# Patient Record
Sex: Male | Born: 1985
Health system: Southern US, Community
[De-identification: ages and names within clinical notes are randomized; demographics above are authoritative.]

## PROBLEM LIST (undated history)

## (undated) HISTORY — PX: MANDIBLE FRACTURE SURGERY: SHX706

---
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
## 2004-03-17 ENCOUNTER — Emergency Department (HOSPITAL_COMMUNITY): Admission: EM | Admit: 2004-03-17 | Discharge: 2004-03-17 | Payer: Self-pay | Admitting: Emergency Medicine

## 2005-03-09 ENCOUNTER — Encounter: Payer: Self-pay | Admitting: Emergency Medicine

## 2005-03-10 ENCOUNTER — Emergency Department (HOSPITAL_COMMUNITY): Admission: EM | Admit: 2005-03-10 | Discharge: 2005-03-10 | Payer: Self-pay | Admitting: Emergency Medicine

## 2005-03-11 ENCOUNTER — Ambulatory Visit (HOSPITAL_COMMUNITY): Admission: RE | Admit: 2005-03-11 | Discharge: 2005-03-12 | Payer: Self-pay | Admitting: Otolaryngology

## 2006-02-15 ENCOUNTER — Emergency Department (HOSPITAL_COMMUNITY): Admission: EM | Admit: 2006-02-15 | Discharge: 2006-02-15 | Payer: Self-pay | Admitting: Emergency Medicine

## 2006-08-25 ENCOUNTER — Emergency Department (HOSPITAL_COMMUNITY): Admission: EM | Admit: 2006-08-25 | Discharge: 2006-08-25 | Payer: Self-pay | Admitting: Emergency Medicine

## 2006-09-22 ENCOUNTER — Encounter (INDEPENDENT_AMBULATORY_CARE_PROVIDER_SITE_OTHER): Payer: Self-pay | Admitting: Specialist

## 2006-09-22 ENCOUNTER — Ambulatory Visit (HOSPITAL_COMMUNITY): Admission: RE | Admit: 2006-09-22 | Discharge: 2006-09-22 | Payer: Self-pay | Admitting: Gastroenterology
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
## 2006-10-04 IMAGING — CR DG CHEST 2V
2 series · 2 of 2 positions shown · non-contrast
Comparison: None.

CLINICAL DATA: Preoperative evaluation prior to mandibular surgery.

CHEST - 2 VIEW

[view not recorded (1 of 2)]
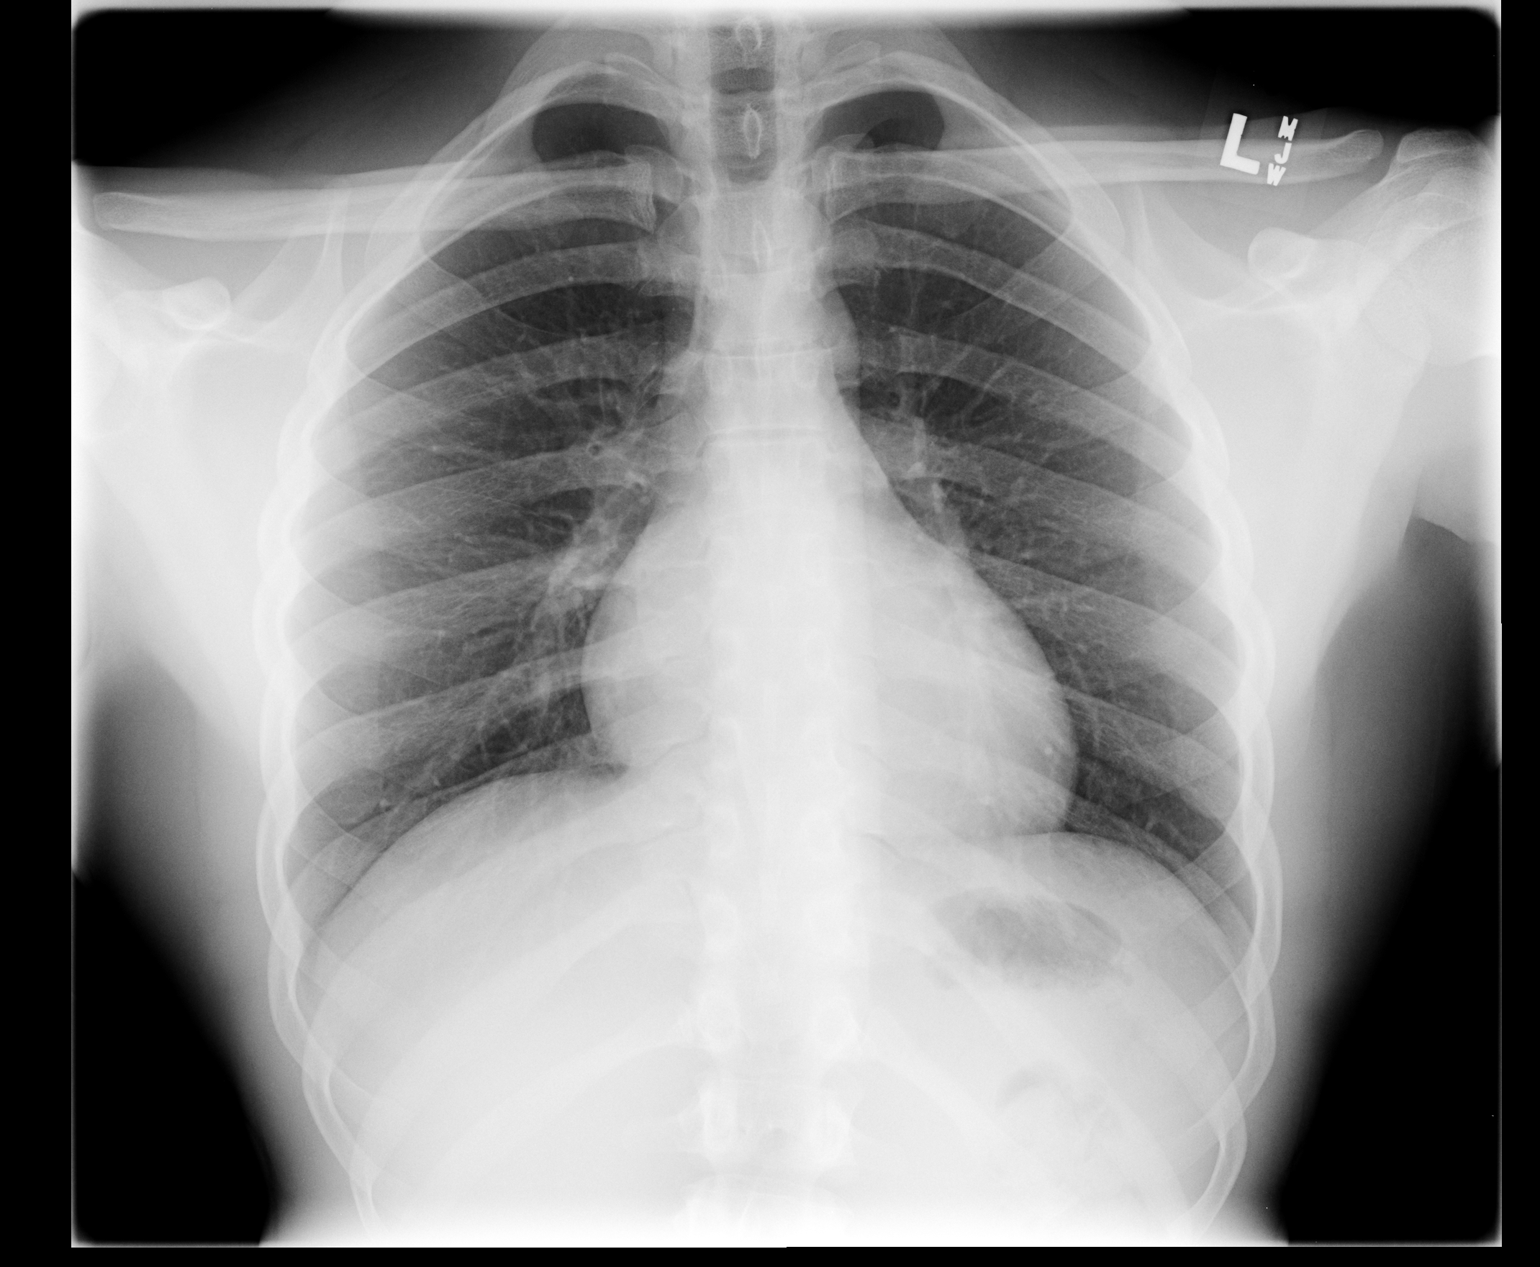

[view not recorded (2 of 2)]
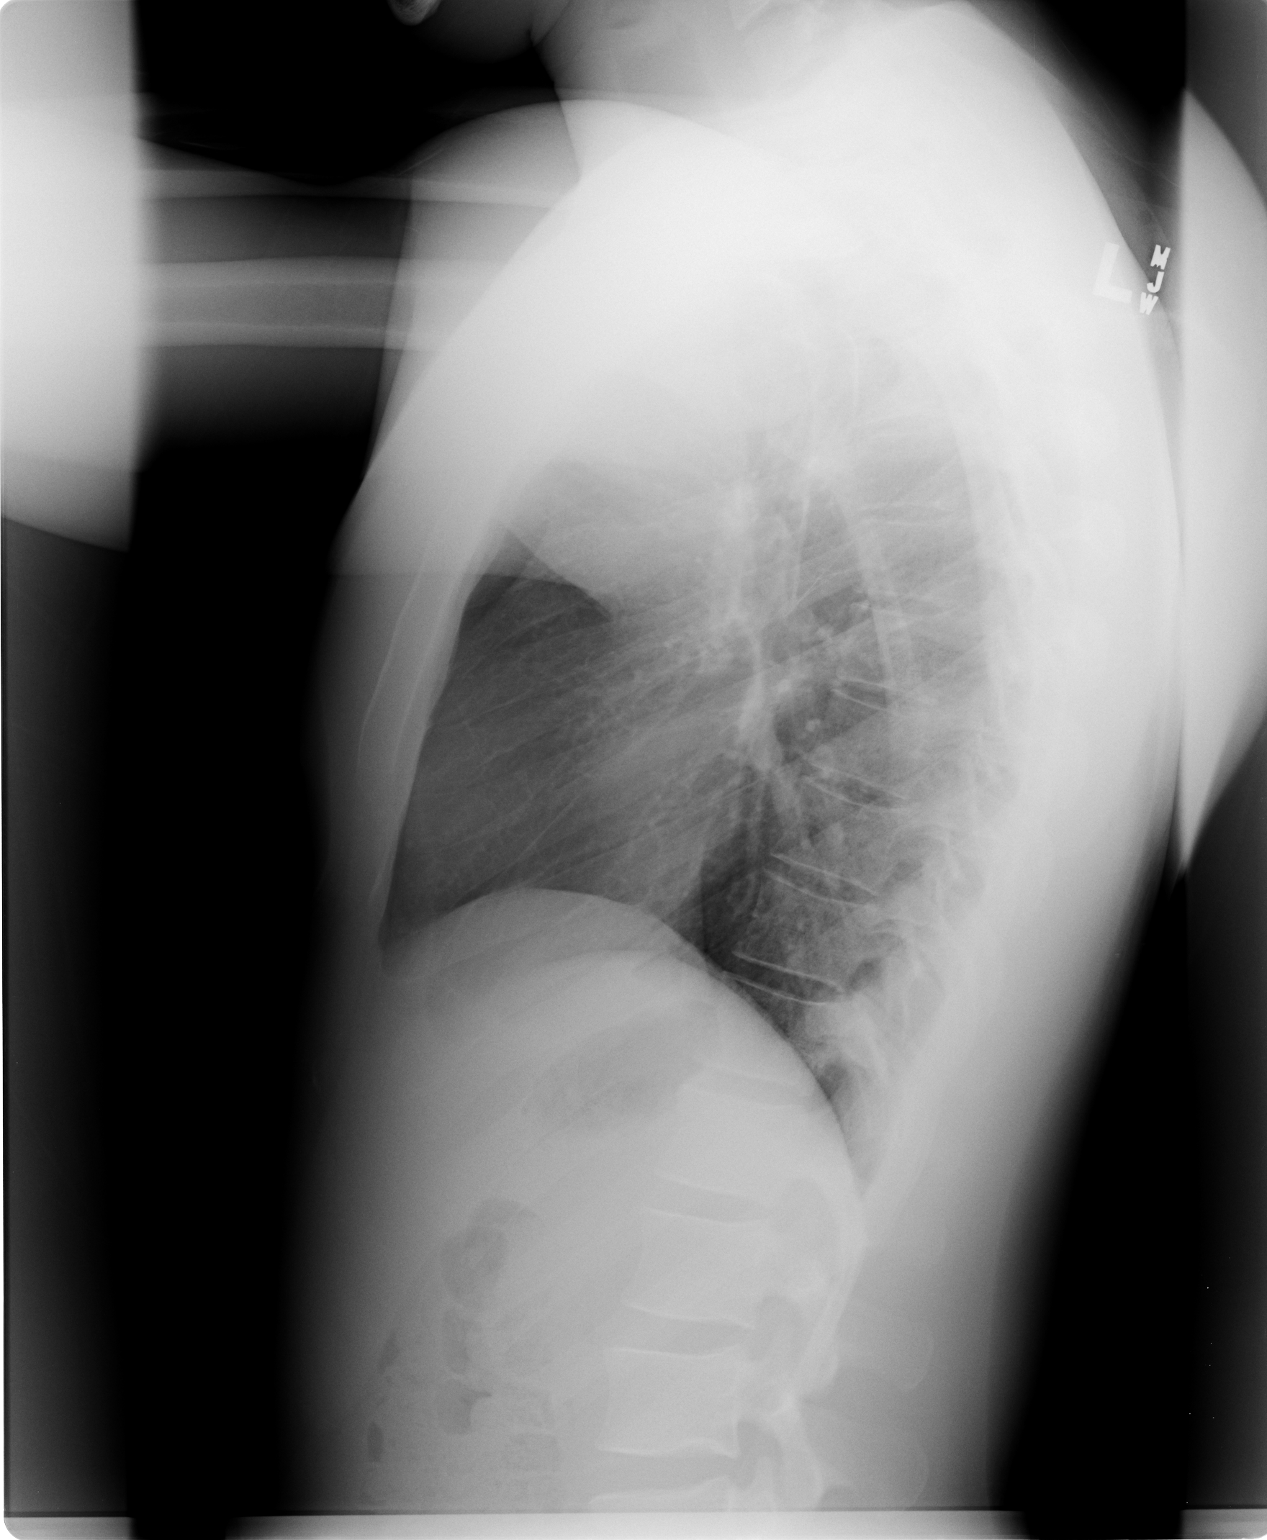

[2 of 2 positions shown; findings below may reference images not displayed]

FINDINGS: Normal sized heart. Central peribronchial thickening. Clear lungs.
Normal scoliosis.

IMPRESSION

Moderate central bronchitic changes.

## 2006-10-05 IMAGING — DX DG ORTHOPANTOGRAM /PANORAMIC
1 series · 1 of 1 positions shown · non-contrast
Comparison: 03/09/05.

CLINICAL DATA: Post reduction.  
 PANOREX ? 1 VIEW:

[view not recorded]
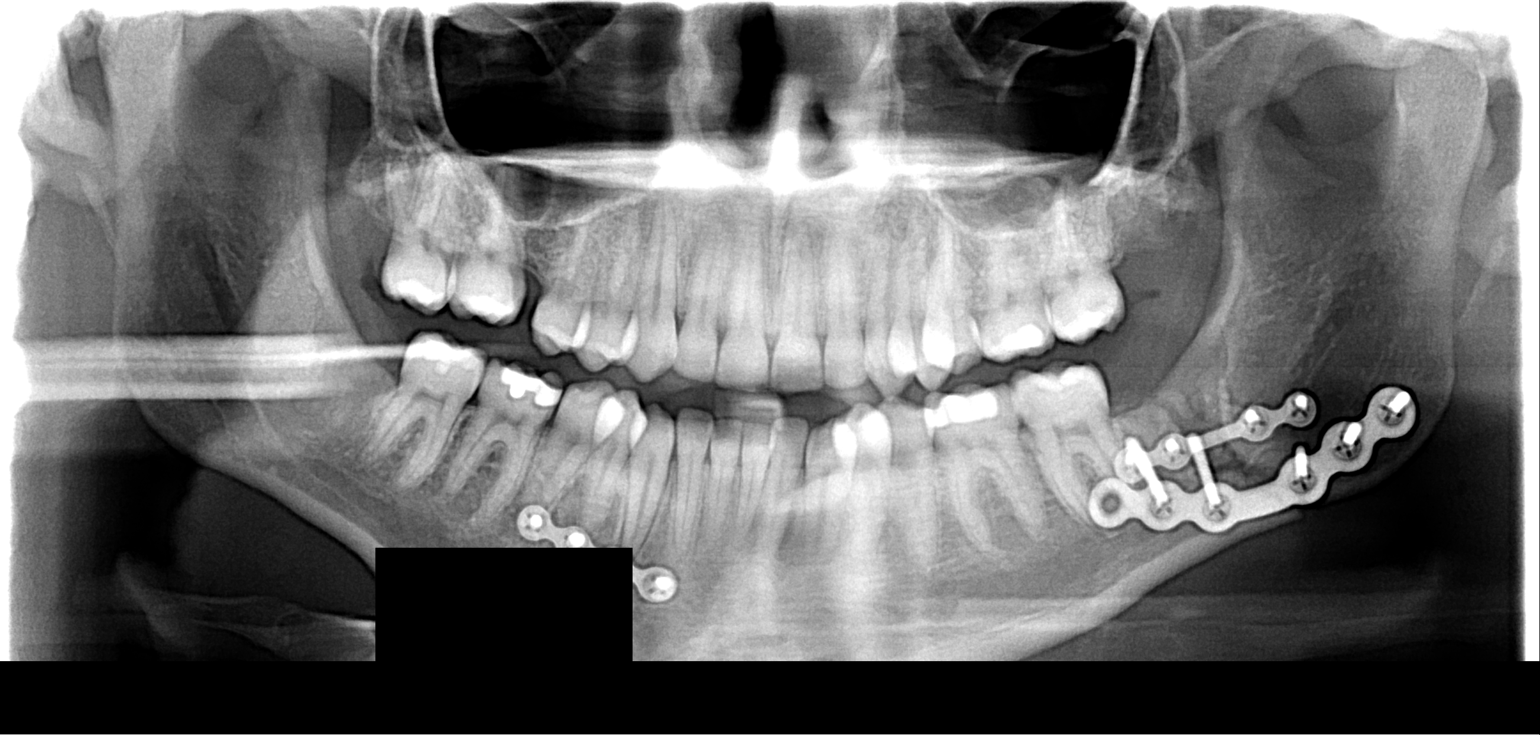

[1 of 1 positions shown; findings below may reference images not displayed]

FINDINGS: Multiple plates and screws are seen transfixing bilateral mandible fractures.  There is anatomic alignment of the mandible fracture fragments.  There is no breakage of loosening of the hardware.
IMPRESSION: ORIF bilateral mandible fractures with anatomic alignment.

## 2006-10-06 ENCOUNTER — Ambulatory Visit: Payer: Self-pay | Admitting: Cardiology

## 2006-10-19 ENCOUNTER — Ambulatory Visit: Payer: Self-pay

## 2006-10-19 ENCOUNTER — Ambulatory Visit: Payer: Self-pay | Admitting: Cardiology

## 2006-10-19 ENCOUNTER — Encounter: Payer: Self-pay | Admitting: Cardiology

## 2006-10-20 ENCOUNTER — Ambulatory Visit: Payer: Self-pay | Admitting: Cardiology

## 2006-10-20 LAB — CONVERTED CEMR LAB
BUN: 10 mg/dL (ref 6–23)
CO2: 33 meq/L — ABNORMAL HIGH (ref 19–32)
Calcium: 9.7 mg/dL (ref 8.4–10.5)
Chloride: 104 meq/L (ref 96–112)
GFR calc Af Amer: 109 mL/min
GFR calc non Af Amer: 90 mL/min
Glucose, Bld: 81 mg/dL (ref 70–99)

## 2007-07-09 ENCOUNTER — Emergency Department (HOSPITAL_COMMUNITY): Admission: EM | Admit: 2007-07-09 | Discharge: 2007-07-09 | Payer: Self-pay | Admitting: Family Medicine

## 2008-12-08 ENCOUNTER — Ambulatory Visit (HOSPITAL_COMMUNITY): Admission: RE | Admit: 2008-12-08 | Discharge: 2008-12-08 | Payer: Self-pay | Admitting: Gastroenterology

## 2010-10-26 NOTE — Assessment & Plan Note (Signed)
HEALTHCARE                            CARDIOLOGY OFFICE NOTE   Kenneth May, Kenneth May                         MRN:          [id number]  DATE:10/06/2006                            DOB:          03-06-1986    PRIMARY CARE PHYSICIAN:  None.   REASON FOR PRESENTATION:  Evaluate patient with ventricular ectopy.   HISTORY OF PRESENT ILLNESS:  The patient is a pleasant 25 year old  African-American gentleman.  He was noted last month during sedation for  an EGD to have a bradyarrhythmia. I do not have the strips of this.  However, his mother who is a nurse described that he had heart rates in  the 40's, perhaps lower.  They may have been junctional escape rhythm.  He had by report of the nurses, premature ventricular contractions  bigeminy.  There were no sustained dysrhythmias reported.  They did  proceed with the EGD after some debate.  They suggested a cardiac  evaluation.   The patient does not notice palpitations.  He has actually been  physically active all his life.  He does not have any presyncope or  syncope.  He gets some fleeting chest discomfort.  He mentions this  occasionally to his Mom.  He feels like pins under his left breast.  He  does not have any sustained discomfort.  This may last for 3 minutes at  a time.  It is sporadic.  He cannot bring it on.  There is no associated  nausea, vomiting or diaphoresis. This does not occur with the  palpitations.  He has no shortness of breath.   He did see Dr. Clelia Croft as a child for some chest discomfort. He apparently  wore a monitor and had an EKG.  There was no abnormality identified  apparently.   PAST MEDICAL HISTORY:  He has no history of hypertension, diabetes or  hyperlipidemia.   PAST SURGICAL HISTORY:  Surgical repair of his jaw.   ALLERGIES:  AMOXICILLIN (caused a rash).   MEDICATIONS:  Zyrtec, Prevacid 40 mg daily.   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  He  is  not married and has no children.  He does not smoke cigarettes or drink  alcohol.   FAMILY HISTORY:  Noncontributory for early coronary artery disease,  sudden cardiac death, cardiomyopathy, syncope, arrhythmias.   REVIEW OF SYSTEMS:  As stated in the HPI, positive for rhinitis and  seasonal allergies.  Negative for other systems.   PHYSICAL EXAMINATION:  The patient is well-appearing and in no distress.  Blood pressure 101/78, heart rate 67 and regular. Weight 150 pounds.  HEENT:  Eyes unremarkable, pupils equal, round and react to light, fundi  within normal limits, oral mucosa unremarkable.  NECK:  No jugular venous distension, wave form within normal limits,  carotid upstroke brisk and symmetric, no bruits, thyromegaly.  LYMPHATICS:  No cervical, axillary, inguinal adenopathy.  LUNGS:  Clear to auscultation bilaterally.  BACK:  No costovertebral angle tenderness.  CHEST:  Unremarkable.  HEART:  PMI not displaced or sustained, S1 and S2  within normal limits,  no S3, no S4, no clicks, no rubs, no murmurs.  ABDOMEN:  Flat, positive bowel sounds, no bruits, no rebound, no  guarding, no midline pulsatile mass, no hepatomegaly, splenomegaly.  SKIN:  No rashes, no nodules.  EXTREMITIES:  Pulses 2+ throughout, no cyanosis, no clubbing, no edema.  NEURO:  Oriented to person, place and time, cranial nerves II-XII  grossly intact, motor grossly intact throughout.   EKG:  Sinus rhythm, rate 67, axis within normal limits, intervals within  normal limits, early repolarization pattern, no acute ST, T wave  changes.   ASSESSMENT AND PLAN:  Bradyarrhythmia and ventricular ectopy.  I do not  have these strips but trust the description provided by his Mom.  At  this point I am not surprised with his physical conditioning that he  would have a slow heart rate.  I do not find anything abnormal on  physical exam or EKG.  I am going to apply a 24 hour Holter monitor and  perform an  echocardiogram.  If these are both normal then the chance of  a significant dysrhythmia or cardiac disorder is unlikely.  No further  cardiovascular testing would be needed.   Followup will be based on the results of the above.     Rollene Rotunda, MD, [facility]  Electronically Signed    JH/MedQ  DD: 10/06/2006  DT: 10/06/2006  Job #: [id number]
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
## 2011-07-17 ENCOUNTER — Emergency Department (INDEPENDENT_AMBULATORY_CARE_PROVIDER_SITE_OTHER)
Admission: EM | Admit: 2011-07-17 | Discharge: 2011-07-17 | Disposition: A | Discharge status: Home / Self Care | Payer: No Typology Code available for payment source | Source: Home / Self Care | Attending: Family Medicine | Admitting: Family Medicine

## 2011-07-17 ENCOUNTER — Encounter (HOSPITAL_COMMUNITY): Payer: Self-pay

## 2011-07-17 DIAGNOSIS — L0291 Cutaneous abscess, unspecified: Secondary | ICD-10-CM

## 2011-07-17 MED ORDER — DOXYCYCLINE HYCLATE 100 MG PO TABS: 100.0000 mg | ORAL_TABLET | Freq: Two times a day (BID) | ORAL | Status: AC

## 2011-07-17 NOTE — ED Notes (Signed)
Pt has abscess on left upper arm that was drained on Thursday and now has redness under arm and on chest and mother is concerned with infection spreading.  He had another abscess drained under his arm on Wed and now has smaller lesion beside larger upper arm abscess.  Pt has tattoo on upper arm for one month.

## 2011-07-17 NOTE — ED Provider Notes (Signed)
History     CSN: [id number]  Arrival date & time 07/17/11  0903   First MD Initiated Contact with Patient 07/17/11 [phone]      Chief Complaint  Patient presents with  . Abscess    (Consider location/radiation/quality/duration/timing/severity/associated sxs/prior treatment) HPI Comments: Damarian presents for evaluation of 2 abscesses on his left shoulder. He states that he had one drained several days ago at [facility]. He states that he first noticed one of the abscesses a week and half ago. He also recently received a new tattoo on the shoulder. He reports that a fellow teammate also had a tattoo from the same parlor and also had an infection. 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 He was placed on antibiotics several days ago and has continued taking them. The wound was not cultured at that time. He denies any fever. And does report some improvement in range of motion in his shoulder and pain. His mother reports increased redness around the wound.  Patient is a 26 y.o. male presenting with abscess. The history is provided by the patient.  Abscess  This is a new problem. The current episode started more than one week ago. The onset was sudden. The problem has been unchanged. The abscess is present on the left arm. The problem is moderate. The abscess is characterized by redness, painfulness and draining. Associated with: new tattoo. The abscess first occurred at school. Pertinent negatives include no fever.    History reviewed. No pertinent past medical history.  Past Surgical History  Procedure Date  . Mandible fracture surgery     History reviewed. No pertinent family history.  History  Substance Use Topics  . Smoking status: Never Smoker   . Smokeless tobacco: Not on file  . Alcohol Use: Yes      Review of Systems  Constitutional: Negative.  Negative for fever.  HENT: Negative.   Eyes: Negative.   Respiratory: Negative.   Cardiovascular: Negative.     Gastrointestinal: Negative.   Genitourinary: Negative.   Musculoskeletal: Negative.   Skin: Positive for wound.       New tattoo, several abscesses over LEFT shoulder  Neurological: Negative.     Allergies  Review of patient's allergies indicates no known allergies.  Home Medications   Current Outpatient Rx  Name Route Sig Dispense Refill  . SULFAMETHOXAZOLE-TRIMETHOPRIM 800-160 MG PO TABS Oral Take 1 tablet by mouth 2 (two) times daily.    Marland Kitchen DOXYCYCLINE HYCLATE 100 MG PO TABS Oral Take 1 tablet (100 mg total) by mouth 2 (two) times daily. 20 tablet 0    BP 125/73  Pulse 68  Temp(Src) 97.7 F (36.5 C) (Oral)  Resp 16  SpO2 98%  Physical Exam  Nursing note and vitals reviewed. Constitutional: He is oriented to person, place, and time. He appears well-developed and well-nourished.  HENT:  Head: Normocephalic and atraumatic.  Eyes: EOM are normal.  Neck: Normal range of motion.  Pulmonary/Chest: Effort normal.  Musculoskeletal: Normal range of motion.  Neurological: He is alert and oriented to person, place, and time.  Skin: Skin is warm and dry. Lesion noted.     Psychiatric: His behavior is normal.    ED Course  Procedures (including critical care time)   Labs Reviewed  CULTURE, ROUTINE-ABSCESS   No results found.   1. Abscess and cellulitis       MDM  Continue taking TMP-SMX, added doxycyline; culture obtained; follow up with primary doctor tomorrow  Richardo Priest, MD 07/17/11 1218

## 2011-07-19 LAB — CULTURE, ROUTINE-ABSCESS: Gram Stain: NONE SEEN

## 2011-07-21 NOTE — ED Notes (Signed)
Abscess culture L arm: Abundant Staph. Aureus.  Pt. adequately treated with Doxycycline. Kenneth May 07/21/2011

## 2016-05-17 MED FILL — ECONAZOLE NITRATE 1% CREAM: 1 | 20 days supply | Qty: 60 | Fill #0

## 2016-07-11 ENCOUNTER — Emergency Department (HOSPITAL_COMMUNITY): Payer: Self-pay

## 2016-07-11 ENCOUNTER — Encounter (HOSPITAL_COMMUNITY): Payer: Self-pay

## 2016-07-11 ENCOUNTER — Emergency Department (HOSPITAL_COMMUNITY)
Admission: EM | Admit: 2016-07-11 | Discharge: 2016-07-11 | Disposition: A | Discharge status: Home / Self Care | Payer: Self-pay | Attending: Emergency Medicine | Admitting: Emergency Medicine

## 2016-07-11 DIAGNOSIS — Z5321 Procedure and treatment not carried out due to patient leaving prior to being seen by health care provider: Secondary | ICD-10-CM | POA: Insufficient documentation

## 2016-07-11 DIAGNOSIS — R0602 Shortness of breath: Secondary | ICD-10-CM | POA: Insufficient documentation
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
## 2016-07-11 NOTE — ED Notes (Signed)
Called for Vitals with no response.

## 2016-07-11 NOTE — ED Notes (Signed)
Patient didn't answer when called for vital
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
## 2017-06-27 ENCOUNTER — Other Ambulatory Visit: Payer: Self-pay

## 2017-06-27 ENCOUNTER — Ambulatory Visit (HOSPITAL_COMMUNITY)
Admission: EM | Admit: 2017-06-27 | Discharge: 2017-06-27 | Disposition: A | Discharge status: Home / Self Care | Payer: Self-pay | Attending: Family Medicine | Admitting: Family Medicine

## 2017-06-27 ENCOUNTER — Encounter (HOSPITAL_COMMUNITY): Payer: Self-pay | Admitting: Emergency Medicine

## 2017-06-27 DIAGNOSIS — M25551 Pain in right hip: Secondary | ICD-10-CM

## 2017-06-27 DIAGNOSIS — M25511 Pain in right shoulder: Secondary | ICD-10-CM

## 2017-06-27 MED ORDER — CYCLOBENZAPRINE HCL 10 MG PO TABS: 10.0000 mg | ORAL_TABLET | Freq: Two times a day (BID) | ORAL | 0 refills | Status: AC | PRN

## 2017-06-27 MED ORDER — IBUPROFEN 800 MG PO TABS: 800.0000 mg | ORAL_TABLET | Freq: Three times a day (TID) | ORAL | 0 refills | Status: AC
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
## 2017-07-07 MED FILL — CLINDAMYCIN HCL 150 MG CAPS: 150 | 7 days supply | Qty: 28 | Fill #0

## 2017-07-07 MED FILL — CYCLOBENZAPRINE 10 MG TAB: 10 | 7 days supply | Qty: 14 | Fill #0

## 2017-07-07 MED FILL — IBUPROFEN 800 MG TAB: 800 | 7 days supply | Qty: 21 | Fill #0
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
## 2019-04-24 ENCOUNTER — Other Ambulatory Visit: Payer: Self-pay

## 2019-04-24 DIAGNOSIS — Z20822 Contact with and (suspected) exposure to covid-19: Secondary | ICD-10-CM

## 2019-04-26 LAB — NOVEL CORONAVIRUS, NAA: SARS-CoV-2, NAA: NOT DETECTED

## 2019-04-30 ENCOUNTER — Other Ambulatory Visit: Payer: Self-pay

## 2019-04-30 DIAGNOSIS — Z20822 Contact with and (suspected) exposure to covid-19: Secondary | ICD-10-CM

## 2019-05-01 LAB — NOVEL CORONAVIRUS, NAA: SARS-CoV-2, NAA: NOT DETECTED
